# Patient Record
Sex: Male | Born: 1990 | Race: Black or African American | Hispanic: No | Marital: Single | Smoking: Never smoker
Health system: Southern US, Community
[De-identification: ages and names within clinical notes are randomized; demographics above are authoritative.]

---

## 2010-07-27 ENCOUNTER — Emergency Department (HOSPITAL_BASED_OUTPATIENT_CLINIC_OR_DEPARTMENT_OTHER)
Admission: EM | Admit: 2010-07-27 | Discharge: 2010-07-27 | Discharge status: Against Medical Advice | Payer: Managed Care, Other (non HMO) | Attending: Emergency Medicine | Admitting: Emergency Medicine

## 2010-07-27 DIAGNOSIS — K625 Hemorrhage of anus and rectum: Secondary | ICD-10-CM | POA: Insufficient documentation

## 2015-12-18 ENCOUNTER — Encounter (HOSPITAL_BASED_OUTPATIENT_CLINIC_OR_DEPARTMENT_OTHER): Payer: Self-pay

## 2015-12-18 ENCOUNTER — Emergency Department (HOSPITAL_BASED_OUTPATIENT_CLINIC_OR_DEPARTMENT_OTHER): Payer: Managed Care, Other (non HMO)

## 2015-12-18 ENCOUNTER — Ambulatory Visit (HOSPITAL_BASED_OUTPATIENT_CLINIC_OR_DEPARTMENT_OTHER)
Admission: EM | Admit: 2015-12-18 | Discharge: 2015-12-18 | Disposition: A | Discharge status: Home / Self Care | Payer: Managed Care, Other (non HMO) | Attending: Emergency Medicine | Admitting: Emergency Medicine

## 2015-12-18 ENCOUNTER — Emergency Department (HOSPITAL_COMMUNITY): Payer: Managed Care, Other (non HMO) | Admitting: Certified Registered Nurse Anesthetist

## 2015-12-18 ENCOUNTER — Encounter (HOSPITAL_COMMUNITY)
Admission: EM | Disposition: A | Discharge status: Home / Self Care | Payer: Self-pay | Source: Home / Self Care | Attending: Emergency Medicine

## 2015-12-18 DIAGNOSIS — K6389 Other specified diseases of intestine: Secondary | ICD-10-CM | POA: Diagnosis not present

## 2015-12-18 DIAGNOSIS — X58XXXA Exposure to other specified factors, initial encounter: Secondary | ICD-10-CM | POA: Insufficient documentation

## 2015-12-18 DIAGNOSIS — K3189 Other diseases of stomach and duodenum: Secondary | ICD-10-CM | POA: Insufficient documentation

## 2015-12-18 DIAGNOSIS — T189XXA Foreign body of alimentary tract, part unspecified, initial encounter: Secondary | ICD-10-CM | POA: Diagnosis not present

## 2015-12-18 HISTORY — PX: FOREIGN BODY REMOVAL: SHX962

## 2015-12-18 HISTORY — PX: ESOPHAGOGASTRODUODENOSCOPY (EGD) WITH PROPOFOL: SHX5813

## 2015-12-18 SURGERY — ESOPHAGOGASTRODUODENOSCOPY (EGD) WITH PROPOFOL
Anesthesia: General

## 2015-12-18 SURGERY — EGD (ESOPHAGOGASTRODUODENOSCOPY)
Anesthesia: Monitor Anesthesia Care

## 2015-12-18 MED ORDER — PROPOFOL 10 MG/ML IV BOLUS
INTRAVENOUS | Status: DC | PRN
  Administered 2015-12-18: 150 mg via INTRAVENOUS

## 2015-12-18 MED ORDER — SUCCINYLCHOLINE CHLORIDE 200 MG/10ML IV SOSY
PREFILLED_SYRINGE | INTRAVENOUS | Status: AC
  Filled 2015-12-18: qty 10

## 2015-12-18 MED ORDER — ONDANSETRON HCL 4 MG/2ML IJ SOLN
INTRAMUSCULAR | Status: AC
  Filled 2015-12-18: qty 2

## 2015-12-18 MED ORDER — MIDAZOLAM HCL 2 MG/2ML IJ SOLN
INTRAMUSCULAR | Status: AC
  Filled 2015-12-18: qty 2

## 2015-12-18 MED ORDER — MEPERIDINE HCL 100 MG/ML IJ SOLN: 6.2500 mg | INTRAMUSCULAR | Status: DC | PRN

## 2015-12-18 MED ORDER — LIDOCAINE HCL (CARDIAC) 20 MG/ML IV SOLN
INTRAVENOUS | Status: DC | PRN
  Administered 2015-12-18: 100 mg via INTRAVENOUS

## 2015-12-18 MED ORDER — ONDANSETRON HCL 4 MG/2ML IJ SOLN
INTRAMUSCULAR | Status: DC | PRN
  Administered 2015-12-18: 4 mg via INTRAVENOUS

## 2015-12-18 MED ORDER — MIDAZOLAM HCL 5 MG/5ML IJ SOLN
INTRAMUSCULAR | Status: DC | PRN
  Administered 2015-12-18: 2 mg via INTRAVENOUS

## 2015-12-18 MED ORDER — SUCCINYLCHOLINE CHLORIDE 20 MG/ML IJ SOLN
INTRAMUSCULAR | Status: DC | PRN
  Administered 2015-12-18: 130 mg via INTRAVENOUS

## 2015-12-18 MED ORDER — ONDANSETRON HCL 4 MG/2ML IJ SOLN: 4.0000 mg | Freq: Once | INTRAMUSCULAR | Status: DC | PRN

## 2015-12-18 MED ORDER — LACTATED RINGERS IV SOLN
INTRAVENOUS | Status: DC | PRN
  Administered 2015-12-18: 22:00:00 via INTRAVENOUS

## 2015-12-18 MED ORDER — FENTANYL CITRATE (PF) 100 MCG/2ML IJ SOLN
INTRAMUSCULAR | Status: DC | PRN
  Administered 2015-12-18: 100 ug via INTRAVENOUS

## 2015-12-18 MED ORDER — HYDROMORPHONE HCL 1 MG/ML IJ SOLN: 0.2500 mg | INTRAMUSCULAR | Status: DC | PRN

## 2015-12-18 MED ORDER — LIDOCAINE 2% (20 MG/ML) 5 ML SYRINGE
INTRAMUSCULAR | Status: AC
  Filled 2015-12-18: qty 5

## 2015-12-18 MED ORDER — PROPOFOL 10 MG/ML IV BOLUS
INTRAVENOUS | Status: AC
  Filled 2015-12-18: qty 20

## 2015-12-18 MED ORDER — LACTATED RINGERS IV SOLN
INTRAVENOUS | Status: DC
  Administered 2015-12-18: 1000 mL via INTRAVENOUS

## 2015-12-18 MED ORDER — FENTANYL CITRATE (PF) 250 MCG/5ML IJ SOLN
INTRAMUSCULAR | Status: AC
  Filled 2015-12-18: qty 5

## 2015-12-18 NOTE — ED Notes (Addendum)
Dr Jeananne RamaSchumacher at bedside.

## 2015-12-18 NOTE — Discharge Instructions (Signed)
[facility] go [facility] for further evaluation.

## 2015-12-18 NOTE — ED Notes (Signed)
Swallowed a toothpick this am-denies pain "but feels like something is there"-NAD-steady gait

## 2015-12-18 NOTE — Interval H&P Note (Signed)
History and Physical Interval Note:  12/18/2015 10:22 PM  Lin GivensKeith Dierolf  has presented today for surgery, with the diagnosis of Removal of foreign body  The various methods of treatment have been discussed with the patient and family. After consideration of risks, benefits and other options for treatment, the patient has consented to  Procedure(s): ESOPHAGOGASTRODUODENOSCOPY (EGD) WITH PROPOFOL (N/A) FOREIGN BODY REMOVAL (N/A) as a surgical intervention .  The patient's history has been reviewed, patient examined, no change in status, stable for surgery.  I have reviewed the patient's chart and labs.  Questions were answered to the patient's satisfaction.     Reeves ForthSteven Paul Vincent Ehrler

## 2015-12-18 NOTE — ED Notes (Signed)
Patient states he wants to wait until after he speaks with GI MD to get an IV. GI with patient now.

## 2015-12-18 NOTE — ED Notes (Signed)
MD at bedside. 

## 2015-12-18 NOTE — ED Notes (Signed)
Provided patient with ice water and applesauce per ENT, patient tolerating well at this time.

## 2015-12-18 NOTE — Consult Note (Signed)
 HPI :  25 y/o male with no significant past medical history, presenting with symptoms following foreign body ingestion, consulted by ER staff at the recommendation of Dr. Shoemaker from ENT. I was called at roughly 9 PM regarding this patient. He reports he had a tooth pick in his mouth around 9 AM when he was startled when sleeping, and swallowed it when he was awoken. He has had some odynophagia since that time in his upper chest and sternal notch. He is tolerating his secretions. Dr. Shoemaker performed a laryngoscopy which did not show any obvious pathology and recommended a GI consult. Before I was called the patient was given a trial of liquids and a small amount of apple sauce which was able to swallow, but continues to have some mild odynophagia and chest discomfort. Vitals normal. Prior CXR okay. The patient has no significant medical history, he denies taking any medications.   History reviewed. No pertinent past medical history.  Healthy per patient   History reviewed. No pertinent past surgical history. No surgical history No family history on file. Social History  Substance Use Topics  . Smoking status: Never Smoker   . Smokeless tobacco: None  . Alcohol Use: No   No current facility-administered medications for this encounter.   Current Outpatient Prescriptions  Medication Sig Dispense Refill  . Multiple Vitamins-Minerals (MULTIVITAMIN WITH MINERALS) tablet Take 1 tablet by mouth daily.     No Known Allergies   Review of Systems: All systems reviewed and negative except where noted in HPI.    Dg Chest 2 View  12/18/2015  CLINICAL DATA:  Swallowed a toothpick, initial encounter EXAM: CHEST  2 VIEW COMPARISON:  None. FINDINGS: The heart size and mediastinal contours are within normal limits. Both lungs are clear. The visualized skeletal structures are unremarkable. IMPRESSION: No acute abnormality is noted. No radiopaque foreign body is noted. CT is not likely to be  helpful given the size of the foreign body. Electronically Signed   By: Mark  Lukens M.D.   On: 12/18/2015 18:00    Physical Exam: BP 120/74 mmHg  Pulse 59  Temp(Src) 98 F (36.7 C) (Oral)  Resp 22  Ht 6' (1.829 m)  Wt 200 lb (90.719 kg)  BMI 27.12 kg/m2  SpO2 100% Constitutional: Pleasant,well-developed, male in no acute distress. HEENT: Normocephalic and atraumatic. Conjunctivae are normal. No scleral icterus. Neck supple.  Cardiovascular: Normal rate, regular rhythm.  Pulmonary/chest: Effort normal and breath sounds normal. No wheezing, rales or rhonchi. No crepitus Abdominal: Soft, nondistended, nontender. Bowel sounds active throughout. There are no masses palpable.  Extremities: no edema Lymphadenopathy: No cervical adenopathy noted. Neurological: Alert and oriented to person place and time. Skin: Skin is warm and dry. No rashes noted. Psychiatric: Normal mood and affect. Behavior is normal.   ASSESSMENT AND PLAN: 25 y/o male presenting with foreign body ingestion, regular sized toothpick from his report, following which he has had odynophagia and discomfort. Laryngoscopy unremarkable. Per ASGE guidelines, sharp pointed objects that are ingested, such as a toothpick, are considered a medical emergency due to risk of perforation, especially if lodged in the esophagus. Given his ongoing symptoms emergent EGD is recommended at this time to ensure this is not retained in his esophagus. Further, if the object is visualized in the stomach or small bowel we will remove it if possible as complications from more distal injury of the bowel have been reported in up to 35% of cases. In light of his recent ingestion of   liquids and small amount of apple sauce, he will be electively intubated for this case with anesthesia assistance. I have discussed the risks / benefits of endoscopy with him and anesthesia and he wishes to proceed. Further recommendations pending the result.  Ileene PatrickSteven Armbruster,  MD [facility] Pager [phone]

## 2015-12-18 NOTE — Consult Note (Signed)
ENT CONSULT:  Reason for Consult: Possible foreign body ingestion Referring Physician: [facility]  Christopher Payne is an 25 y.o. male.  HPI: The patient presents for evaluation of dysphagia and low anterior neck pain. He reports swallowing a wooden toothpick early this morning. No respiratory issues, hoarseness or breathing concerns. He had a moderate amount of pain and difficulty swallowing liquids. No regurgitation, aspiration or previous swallowing dysfunction.  History reviewed. No pertinent past medical history.  History reviewed. No pertinent past surgical history.  No family history on file.  Social History:  reports that he has never smoked. He does not have any smokeless tobacco history on file. He reports that he does not drink alcohol or use illicit drugs.  Allergies: No Known Allergies  Medications: I have reviewed the patient's current medications.  No results found for this or any previous visit (from the past 48 hour(s)).  Dg Chest 2 View  12/18/2015  CLINICAL DATA:  Swallowed a toothpick, initial encounter EXAM: CHEST  2 VIEW COMPARISON:  None. FINDINGS: The heart size and mediastinal contours are within normal limits. Both lungs are clear. The visualized skeletal structures are unremarkable. IMPRESSION: No acute abnormality is noted. No radiopaque foreign body is noted. CT is not likely to be helpful given the size of the foreign body. Electronically Signed   By: Alcide CleverMark  Lukens M.D.   On: 12/18/2015 18:00    ROS:ROS 12 systems reviewed and negative except as stated in HPI   Blood pressure 115/73, pulse 68, temperature 98.4 F (36.9 C), temperature source Oral, resp. rate 20, height 6' (1.829 m), weight 90.719 kg (200 lb), SpO2 100 %.  PHYSICAL EXAM: General appearance - alert, well appearing, and in no distress Mouth - mucous membranes moist, pharynx normal without lesions, tonsils normal and No evidence of foreign body, bleeding or mucosal irritation Neck -  supple, no significant adenopathy, nontender to palpation, no crepitance  Procedure Note: Flexible Laryngoscopy  Risks/benefits and possible complications were discussed in detail. Patient understands and agrees to proceed with procedure.   Procedure: 4 mm flexible laryngoscope inserted through the nasal passageway with minimal discomfort. Nasal cavity and nasopharynx patent without discharge, mass or polyp. Normal base of tongue and supraglottis, normal vocal cord mobility. No evidence of vocal cord paralysis, mass or lesion. No evidence of foreign body, laceration or bleeding to the level of the hypopharynx Normal hypopharynx without evidence of mass or aspiration.  Patient tolerated procedure without complication or difficulty.  Kinnie Scalesavid L. Annalee GentaShoemaker, M.D.  Studies Reviewed: Chest x-ray  Assessment/Plan: The patient presents for evaluation of foreign body sensation after swallowing a toothpick. No evidence of foreign body on oropharyngeal or flexible laryngoscopy exam. No evidence of laceration or irritation. Given the patient's history and concerns he may benefit from evaluation by gastroenterology for esophagoscopy to rule out additional foreign body or other esophageal injury.  Christopher Payne 12/18/2015, 8:42 PM

## 2015-12-18 NOTE — Anesthesia Procedure Notes (Signed)
Procedure Name: Intubation Date/Time: 12/18/2015 10:30 PM Performed by: Shiraz Bastyr S Pre-anesthesia Checklist: Patient identified, Emergency Drugs available, Suction available, Patient being monitored and Timeout performed Patient Re-evaluated:Patient Re-evaluated prior to inductionOxygen Delivery Method: Circle system utilized Preoxygenation: Pre-oxygenation with 100% oxygen Intubation Type: IV induction Ventilation: Mask ventilation without difficulty Laryngoscope Size: Mac and 4 Grade View: Grade I Tube type: Oral Tube size: 7.5 mm Number of attempts: 1 Airway Equipment and Method: Stylet Placement Confirmation: ETT inserted through vocal cords under direct vision,  positive ETCO2 and breath sounds checked- equal and bilateral Secured at: 22 cm Tube secured with: Tape Dental Injury: Teeth and Oropharynx as per pre-operative assessment

## 2015-12-18 NOTE — ED Notes (Signed)
Patient to go to endoscopy. Informed consent completed by endo RN.

## 2015-12-18 NOTE — ED Notes (Signed)
Patient now NPO, in event GI plans to endo scope tonight. Patient shows no signs of distress.

## 2015-12-18 NOTE — Anesthesia Preprocedure Evaluation (Signed)
Anesthesia Evaluation  Patient identified by MRN, date of birth, ID band Patient awake    Reviewed: Allergy & Precautions, NPO status , Patient's Chart, lab work & pertinent test results  Airway Mallampati: I  TM Distance: >3 FB Neck ROM: Full    Dental   Pulmonary    Pulmonary exam normal       Cardiovascular Normal cardiovascular exam    Neuro/Psych    GI/Hepatic   Endo/Other    Renal/GU      Musculoskeletal   Abdominal   Peds  Hematology   Anesthesia Other Findings   Reproductive/Obstetrics                             Anesthesia Physical Anesthesia Plan  ASA: II and emergent  Anesthesia Plan: General   Post-op Pain Management:    Induction: Intravenous, Rapid sequence and Cricoid pressure planned  Airway Management Planned: Oral ETT  Additional Equipment:   Intra-op Plan:   Post-operative Plan: Extubation in OR  Informed Consent: I have reviewed the patients History and Physical, chart, labs and discussed the procedure including the risks, benefits and alternatives for the proposed anesthesia with the patient or authorized representative who has indicated his/her understanding and acceptance.     Plan Discussed with: CRNA and Surgeon  Anesthesia Plan Comments:         Anesthesia Quick Evaluation  

## 2015-12-18 NOTE — H&P (View-Only) (Signed)
HPI :  25 y/o male with no significant past medical history, presenting with symptoms following foreign body ingestion, consulted by ER staff at the recommendation of Dr. Annalee GentaShoemaker from ENT. I was called at roughly 9 PM regarding this patient. He reports he had a tooth pick in his mouth around 9 AM when he was startled when sleeping, and swallowed it when he was awoken. He has had some odynophagia since that time in his upper chest and sternal notch. He is tolerating his secretions. Dr. Annalee GentaShoemaker performed a laryngoscopy which did not show any obvious pathology and recommended a GI consult. Before I was called the patient was given a trial of liquids and a small amount of apple sauce which was able to swallow, but continues to have some mild odynophagia and chest discomfort. Vitals normal. Prior CXR okay. The patient has no significant medical history, he denies taking any medications.   History reviewed. No pertinent past medical history.  Healthy per patient   History reviewed. No pertinent past surgical history. No surgical history No family history on file. Social History  Substance Use Topics  . Smoking status: Never Smoker   . Smokeless tobacco: None  . Alcohol Use: No   No current facility-administered medications for this encounter.   Current Outpatient Prescriptions  Medication Sig Dispense Refill  . Multiple Vitamins-Minerals (MULTIVITAMIN WITH MINERALS) tablet Take 1 tablet by mouth daily.     No Known Allergies   Review of Systems: All systems reviewed and negative except where noted in HPI.    Dg Chest 2 View  12/18/2015  CLINICAL DATA:  Swallowed a toothpick, initial encounter EXAM: CHEST  2 VIEW COMPARISON:  None. FINDINGS: The heart size and mediastinal contours are within normal limits. Both lungs are clear. The visualized skeletal structures are unremarkable. IMPRESSION: No acute abnormality is noted. No radiopaque foreign body is noted. CT is not likely to be  helpful given the size of the foreign body. Electronically Signed   By: Alcide CleverMark  Lukens M.D.   On: 12/18/2015 18:00    Physical Exam: BP 120/74 mmHg  Pulse 59  Temp(Src) 98 F (36.7 C) (Oral)  Resp 22  Ht 6' (1.829 m)  Wt 200 lb (90.719 kg)  BMI 27.12 kg/m2  SpO2 100% Constitutional: Pleasant,well-developed, male in no acute distress. HEENT: Normocephalic and atraumatic. Conjunctivae are normal. No scleral icterus. Neck supple.  Cardiovascular: Normal rate, regular rhythm.  Pulmonary/chest: Effort normal and breath sounds normal. No wheezing, rales or rhonchi. No crepitus Abdominal: Soft, nondistended, nontender. Bowel sounds active throughout. There are no masses palpable.  Extremities: no edema Lymphadenopathy: No cervical adenopathy noted. Neurological: Alert and oriented to person place and time. Skin: Skin is warm and dry. No rashes noted. Psychiatric: Normal mood and affect. Behavior is normal.   ASSESSMENT AND PLAN: 25 y/o male presenting with foreign body ingestion, regular sized toothpick from his report, following which he has had odynophagia and discomfort. Laryngoscopy unremarkable. Per ASGE guidelines, sharp pointed objects that are ingested, such as a toothpick, are considered a medical emergency due to risk of perforation, especially if lodged in the esophagus. Given his ongoing symptoms emergent EGD is recommended at this time to ensure this is not retained in his esophagus. Further, if the object is visualized in the stomach or small bowel we will remove it if possible as complications from more distal injury of the bowel have been reported in up to 35% of cases. In light of his recent ingestion of  liquids and small amount of apple sauce, he will be electively intubated for this case with anesthesia assistance. I have discussed the risks / benefits of endoscopy with him and anesthesia and he wishes to proceed. Further recommendations pending the result.  Ileene PatrickSteven Vibha Ferdig,  MD [facility] Pager [phone]

## 2015-12-18 NOTE — ED Notes (Signed)
Pa  at bedside. 

## 2015-12-18 NOTE — ED Notes (Signed)
[facility] with [facility] Object.

## 2015-12-18 NOTE — Anesthesia Postprocedure Evaluation (Signed)
Anesthesia Post Note  Patient: Christopher GivensKeith Payne  Procedure(s) Performed: Procedure(s) (LRB): ESOPHAGOGASTRODUODENOSCOPY (EGD) WITH PROPOFOL (N/A) FOREIGN BODY REMOVAL (N/A)  Patient location during evaluation: PACU Anesthesia Type: General Level of consciousness: awake and alert Pain management: pain level controlled Vital Signs Assessment: post-procedure vital signs reviewed and stable Respiratory status: spontaneous breathing, nonlabored ventilation, respiratory function stable and patient connected to nasal cannula oxygen Cardiovascular status: blood pressure returned to baseline and stable Postop Assessment: no signs of nausea or vomiting Anesthetic complications: no    Last Vitals:  Filed Vitals:   12/18/15 2317 12/18/15 2330  BP: 112/64 135/73  Pulse: 62 72  Temp: 36.5 C 36.5 C  Resp: 15 19    Last Pain:  Filed Vitals:   12/18/15 2347  PainSc: 5                  Solstice Lastinger DAVID

## 2015-12-18 NOTE — ED Provider Notes (Signed)
CSN: [id number]     Arrival date & time 12/18/15  1650 History   First MD Initiated Contact with Patient 12/18/15 1655     Chief Complaint  Patient presents with  . Swallowed Foreign Body    (Consider location/radiation/quality/duration/timing/severity/associated sxs/prior Treatment) Patient is a 25 y.o. male presenting with foreign body swallowed. The history is provided by the patient and medical records. No language interpreter was used.  Swallowed Foreign Body Pertinent negatives include no abdominal pain, chills, congestion, coughing, fever, headaches, nausea, neck pain, rash, sore throat or vomiting.   Christopher Payne is an otherwise healthy 25 y.o. male  who presents to the Emergency Department after swallowing a toothpick this morning. Patient states after he swallowed toothpick, he felt as if it was stuck in his throat. This sensation has not improved throughout the day. Patient thought it may pass, but he still feels as if something is stuck in his throat. He has no difficulty breathing or swallowing. He has not tried to eat or drink anything since the incident, because he was scared it may make things worse. Denies chest or throat pain, just for body sensation that he states is uncomfortable. He tried to "gag it up". Denies any other associated symptoms.   History reviewed. No pertinent past medical history. History reviewed. No pertinent past surgical history. No family history on file. Social History  Substance Use Topics  . Smoking status: Never Smoker   . Smokeless tobacco: None  . Alcohol Use: No    Review of Systems  Constitutional: Negative for fever and chills.  HENT: Negative for congestion, sore throat and trouble swallowing.   Eyes: Negative for visual disturbance.  Respiratory: Negative for cough and shortness of breath.   Cardiovascular: Negative.   Gastrointestinal: Negative for nausea, vomiting and abdominal pain.  Genitourinary: Negative for dysuria.   Musculoskeletal: Negative for back pain and neck pain.  Skin: Negative for rash.  Neurological: Negative for headaches.      Allergies  Review of patient's allergies indicates no known allergies.  Home Medications   Prior to Admission medications   Not on File   BP 123/83 mmHg  Pulse 63  Temp(Src) 98.7 F (37.1 C) (Oral)  Resp 16  Ht 6' (1.829 m)  Wt 90.719 kg  BMI 27.12 kg/m2  SpO2 100% Physical Exam  Constitutional: He is oriented to person, place, and time. He appears well-developed and well-nourished.  Alert and in no acute distress  HENT:  Head: Normocephalic and atraumatic.  Mouth/Throat: Oropharynx is clear and moist. No oropharyngeal exudate.  Cardiovascular: Normal rate, regular rhythm and normal heart sounds.  Exam reveals no gallop and no friction rub.   No murmur heard. Pulmonary/Chest: Effort normal and breath sounds normal. No respiratory distress. He has no wheezes. He has no rales. He exhibits no tenderness.  Speaking in full sentences. 100% O2 on RA.   Abdominal: Soft. He exhibits no distension. There is no tenderness.  Musculoskeletal: He exhibits no edema.  Neurological: He is alert and oriented to person, place, and time.  Skin: Skin is warm and dry.  Nursing note and vitals reviewed.   ED Course  Procedures (including critical care time) Labs Review Labs Reviewed - No data to display  Imaging Review Dg Chest 2 View  12/18/2015  CLINICAL DATA:  Swallowed a toothpick, initial encounter EXAM: CHEST  2 VIEW COMPARISON:  None. FINDINGS: The heart size and mediastinal contours are within normal limits. Both lungs are clear. The visualized skeletal  structures are unremarkable. IMPRESSION: No acute abnormality is noted. No radiopaque foreign body is noted. CT is not likely to be helpful given the size of the foreign body. Electronically Signed   By: Alcide CleverMark  Lukens M.D.   On: 12/18/2015 18:00   I have personally reviewed and evaluated these images and lab  results as part of my medical decision-making.   EKG Interpretation None      MDM   Final diagnoses:  Swallowed foreign body, initial encounter   Christopher Payne presents to ED after swallowing a toothpick this morning. Feels as if toothpick is still stuck in his throat. On exam, patient is well appearing, speaking in full sentences with clear lung exam. No FB seen on oral examination. GI consulted who recommends CXR and see if patient can tolerate PO. CXR reassuring. Tolerating PO without difficulty. ENT consulted - [facility]. Spoke with [facility] physician Dr. Jodi MourningZavitz. ENT Dr. Annalee GentaShoemaker to be informed of patient's arrival in ED.   Patient seen by and discussed with Dr. Adela LankFloyd who agrees with treatment plan.    [facility] Pilcher Ward, PA-C 12/18/15 1827  Melene Planan Floyd, DO 12/18/15 2308

## 2015-12-18 NOTE — ED Notes (Signed)
[facility]

## 2015-12-18 NOTE — Transfer of Care (Signed)
Immediate Anesthesia Transfer of Care Note  Patient: Christopher GivensKeith Payne  Procedure(s) Performed: Procedure(s): ESOPHAGOGASTRODUODENOSCOPY (EGD) WITH PROPOFOL (N/A) FOREIGN BODY REMOVAL (N/A)  Patient Location: PACU  Anesthesia Type:General  Level of Consciousness: awake, oriented and patient cooperative  Airway & Oxygen Therapy: Patient Spontanous Breathing and Patient connected to nasal cannula oxygen  Post-op Assessment: Report given to RN and Post -op Vital signs reviewed and stable  Post vital signs: Reviewed and stable  Last Vitals:  Filed Vitals:   12/18/15 1928 12/18/15 2130  BP: 115/73 120/74  Pulse: 68 59  Temp: 36.9 C 36.7 C  Resp: 20 22    Last Pain:  Filed Vitals:   12/18/15 2133  PainSc: 5          Complications: No apparent anesthesia complications

## 2015-12-18 NOTE — Op Note (Signed)
[facility] Patient Name: Lin GivensKeith Mulkern Procedure Date : 12/18/2015 MRN: [id number] Attending MD: Willaim RayasSteven P. Adela LankArmbruster , MD Date of Birth: [birth year] CSN: [id number] Age: [age] Admit Type: Inpatient Procedure:                Upper GI endoscopy Indications:              foreign body in the GI tract, toothpick ingestion Providers:                Viviann SpareSteven P. Adela LankArmbruster, MD, Priscella MannAutumn Goldsmith, RN,                            Beryle BeamsJanie Billups, Technician Referring MD:              Medicines:                Monitored Anesthesia Care Complications:            No immediate complications. Estimated blood loss:                            None. Estimated Blood Loss:     Estimated blood loss: none. Procedure:                Pre-Anesthesia Assessment:                           - Prior to the procedure, a History and Physical                            was performed, and patient medications and                            allergies were reviewed. The patient's tolerance of                            previous anesthesia was also reviewed. The risks                            and benefits of the procedure and the sedation                            options and risks were discussed with the patient.                            All questions were answered, and informed consent                            was obtained. Prior Anticoagulants: The patient has                            taken no previous anticoagulant or antiplatelet                            agents. ASA Grade Assessment: II - A patient with  mild systemic disease. After reviewing the risks                            and benefits, the patient was deemed in                            satisfactory condition to undergo the procedure.                           After obtaining informed consent, the endoscope was                            passed under direct vision. Throughout the                            procedure,  the patient's blood pressure, pulse, and                            oxygen saturations were monitored continuously. The                            EG-[id number] ([id number]) scope was introduced through the                            mouth, and advanced to the jejunum. The upper GI                            endoscopy was accomplished without difficulty. The                            patient tolerated the procedure well. Scope In: Scope Out: Findings:      Esophagogastric landmarks were identified: the Z-line was found at 44       cm, the gastroesophageal junction was found at 44 cm and the upper       extent of the gastric folds was found at 44 cm from the incisors.      The exam of the esophagus was otherwise normal. Tooth pick was not in       the esophagus, no mucosal trauma to the esophagus was noted.      Patchy moderately erythematous mucosa with what appeared to be multiple       small wrents of the mucosa, likely from trauma due to the toothpick, was       found in the entire examined stomach. No active bleeding was noted.      The exam of the stomach was otherwise normal.      Patchy erythematous mucosa with small wrents was found in the entire       duodenum consistent with trauma from the toothpick.      The upper endoscope was removed and a colonoscope was placed to reach       the proximal jejunum. Patchy erythematous mucosa with small mucosal       wrents was found in the jejunum consistent with mucosal trauma from the       toothpick. The toothpick was not visualized on this exam. Impression:               -  Esophagogastric landmarks identified.                           - Normal esophagus                           - Erythematous mucosa in the stomach.                           - Mucosal trauma related changes in the stomach,                            duodenum, and proximal jejunum from toothpick,                            however the toothpick has passed to the more distal                             small bowel and was not visualized. Moderate Sedation:      No moderate sedation, case performed with MAC Recommendation:           - Patient has a contact number available for                            emergencies. The signs and symptoms of potential                            delayed complications were discussed with the                            patient. Return to normal activities tomorrow.                            Written discharge instructions were provided to the                            patient.                           - Okay to discharge home                           - Liquid diet rest of today, advance to soft diet                            tomorrow as tolerated                           - Continue present medications.                           - Monitor for abdominal pain, fevers, vomiting,                            etc. Please return to the hospital if you develop  any of these symptoms                           - You will be contacted by our office for                            reassessment over the phone tomorrow, with                            consideration for imaging should you develop any                            symptoms. Procedure Code(s):        --- Professional ---                           [id number], Esophagogastroduodenoscopy, flexible,                            transoral; diagnostic, including collection of                            specimen(s) by brushing or washing, when performed                            (separate procedure) Diagnosis Code(s):        --- Professional ---                           K31.89, Other diseases of stomach and duodenum                           K63.89, Other specified diseases of intestine                           T18.9XXA, Foreign body of alimentary tract, part                            unspecified, initial encounter CPT copyright 2016 [employer].  All rights reserved. The codes documented in this report are preliminary and upon coder review may  be revised to meet current compliance requirements. Viviann SpareSteven P. Armbruster, MD 12/18/2015 11:06:36 PM This report has been signed electronically. Number of Addenda: 0

## 2015-12-19 ENCOUNTER — Encounter (HOSPITAL_COMMUNITY): Payer: Self-pay | Admitting: Gastroenterology

## 2015-12-19 ENCOUNTER — Telehealth: Payer: Self-pay | Admitting: *Deleted

## 2015-12-19 NOTE — Telephone Encounter (Signed)
-----   Message from Ruffin FrederickSteven Paul Armbruster, MD sent at 12/18/2015 11:14 PM EDT ----- Rene Kocheregina I performed an urgent endoscopy for this patient this evening after he swallowed a tooth pick, it had already passed into his small bowel. Can you call him Wed to ensure he is doing okay? If he has any pain, fevers, rectal bleeding he needs to go to the ER for reassessment. More than likely this will pass on its own, but has potential to perforate the bowel if it gets hung up. He felt well upon discharge this evening. Thanks

## 2015-12-19 NOTE — Telephone Encounter (Signed)
Left a message for patient to call back with update on condition.

## 2015-12-19 NOTE — Telephone Encounter (Signed)
Left a message for patient to call back. 

## 2015-12-20 NOTE — Telephone Encounter (Signed)
Patient did not return calls. MD aware.

## 2016-11-16 ENCOUNTER — Encounter (HOSPITAL_BASED_OUTPATIENT_CLINIC_OR_DEPARTMENT_OTHER): Payer: Self-pay | Admitting: Emergency Medicine

## 2016-11-16 ENCOUNTER — Emergency Department (HOSPITAL_BASED_OUTPATIENT_CLINIC_OR_DEPARTMENT_OTHER)
Admission: EM | Admit: 2016-11-16 | Discharge: 2016-11-16 | Disposition: A | Discharge status: Home / Self Care | Payer: Managed Care, Other (non HMO) | Attending: Emergency Medicine | Admitting: Emergency Medicine

## 2016-11-16 DIAGNOSIS — G44209 Tension-type headache, unspecified, not intractable: Secondary | ICD-10-CM | POA: Diagnosis not present

## 2016-11-16 DIAGNOSIS — G44201 Tension-type headache, unspecified, intractable: Secondary | ICD-10-CM

## 2016-11-16 DIAGNOSIS — R51 Headache: Secondary | ICD-10-CM | POA: Diagnosis present

## 2016-11-16 MED ORDER — ACETAMINOPHEN 325 MG PO TABS
650.0000 mg | ORAL_TABLET | Freq: Once | ORAL | Status: DC
  Filled 2016-11-16: qty 2

## 2016-11-16 MED ORDER — KETOROLAC TROMETHAMINE 60 MG/2ML IM SOLN
60.0000 mg | Freq: Once | INTRAMUSCULAR | Status: AC
  Administered 2016-11-16: 60 mg via INTRAMUSCULAR
  Filled 2016-11-16: qty 2

## 2016-11-16 MED ORDER — IBUPROFEN 800 MG PO TABS
800.0000 mg | ORAL_TABLET | Freq: Once | ORAL | Status: DC
  Filled 2016-11-16: qty 1

## 2016-11-16 NOTE — ED Notes (Signed)
Pt given d/c instructions as per chart. Verbalizes understanding. No questions. 

## 2016-11-16 NOTE — ED Provider Notes (Signed)
[facility] Provider Note   CSN: [id number] Arrival date & time: 11/16/16  1942  By signing my name below, I, Christopher Payne, attest that this documentation has been prepared under the direction and in the presence of [name], Sharlette Jansma, MD . Electronically Signed: Teofilo PodMatthew P. Payne, ED Scribe. 11/16/2016. 9:07 PM.    History   Chief Complaint Chief Complaint  Patient presents with  . Headache    The history is provided by the patient. No language interpreter was used.   HPI Comments:  Lin GivensKeith Payne is a 26 y.o. male who presents to the Emergency Department complaining of an intermittent headache x 2 days. He states that the headache is mild at this time, but has been worse. Pt reports that he has been working long hours and has been taking caffeine pills frequently, and has not been getting much sleep. He states that he is otherwise healthy. No alleviating factors noted. Denies fever, photophobia.    History reviewed. No pertinent past medical history.  Patient Active Problem List   Diagnosis Date Noted  . Swallowed foreign body     Past Surgical History:  Procedure Laterality Date  . ESOPHAGOGASTRODUODENOSCOPY (EGD) WITH PROPOFOL N/A 12/18/2015   Procedure: ESOPHAGOGASTRODUODENOSCOPY (EGD) WITH PROPOFOL;  Surgeon: Ruffin FrederickSteven Paul Armbruster, MD;  [facility];  Service: Gastroenterology;  Laterality: N/A;  . FOREIGN BODY REMOVAL N/A 12/18/2015   Procedure: FOREIGN BODY REMOVAL;  Surgeon: Ruffin FrederickSteven Paul Armbruster, MD;  [facility];  Service: Gastroenterology;  Laterality: N/A;       Home Medications    Prior to Admission medications   Medication Sig Start Date End Date Taking? Authorizing Provider  Multiple Vitamins-Minerals (MULTIVITAMIN WITH MINERALS) tablet Take 1 tablet by mouth daily.    [provider]    Family History No family history on file.  Social History Social History  Substance Use Topics  . Smoking status:  Never Smoker  . Smokeless tobacco: Not on file  . Alcohol use No     Allergies   Patient has no known allergies.   Review of Systems Review of Systems All systems reviewed and are negative for acute change except as noted in the HPI.   Physical Exam Updated Vital Signs BP 127/75 (BP Location: Left Arm)   Pulse 75   Temp 98.4 F (36.9 C) (Oral)   Resp 16   SpO2 100%   Physical Exam  Constitutional: He is oriented to person, place, and time. He appears well-developed and well-nourished. No distress.  HENT:  Head: Normocephalic and atraumatic.  Mouth/Throat: Oropharynx is clear and moist.  Eyes: Conjunctivae and EOM are normal. Pupils are equal, round, and reactive to light. Right eye exhibits no discharge. Left eye exhibits no discharge.  photophobia  Neck: Normal range of motion. Neck supple. No spinous process tenderness present. No neck rigidity. No Brudzinski's sign and no Kernig's sign noted.  Cardiovascular: Normal rate, normal heart sounds and intact distal pulses.   No murmur heard. Pulmonary/Chest: Effort normal and breath sounds normal. No respiratory distress. He has no wheezes. He has no rales.  Abdominal: Soft. He exhibits no distension. There is no tenderness.  Musculoskeletal: Normal range of motion. He exhibits no edema or tenderness.  Lymphadenopathy:    He has no cervical adenopathy.  Neurological: He is alert and oriented to person, place, and time. He has normal strength. No cranial nerve deficit or sensory deficit. Coordination and gait normal. GCS eye subscore is 4. GCS verbal subscore is  5. GCS motor subscore is 6.  Skin: Skin is warm and dry.  Psychiatric: He has a normal mood and affect. His behavior is normal.  Nursing note and vitals reviewed.    ED Treatments / Results  DIAGNOSTIC STUDIES:  Oxygen Saturation is 100% on RA, normal by my interpretation.    COORDINATION OF CARE:  9:04 PM Discussed treatment plan with pt at bedside and pt  agreed to plan.   Labs (all labs ordered are listed, but only abnormal results are displayed) Labs Reviewed - No data to display  EKG  EKG Interpretation None       Radiology No results found.  Procedures Procedures (including critical care time)  Medications Ordered in ED Medications  ibuprofen (ADVIL,MOTRIN) tablet 800 mg (not administered)  acetaminophen (TYLENOL) tablet 650 mg (not administered)     Initial Impression / Assessment and Plan / ED Course  I have reviewed the triage vital signs and the nursing notes.  Pertinent labs & imaging results that were available during my care of the patient were reviewed by me and considered in my medical decision making (see chart for details).     Pt with typical common HA which is most likely from taking caffiene pills and lack of sleep without sx suggestive of SAH(sudden onset, worst of life, or deficits), infection, or cavernous vein thrombosis.  Normal neuro exam and vital signs. Pt given toradol with improvement of headache and he was d/ced home.  Final Clinical Impressions(s) / ED Diagnoses   Final diagnoses:  Acute intractable tension-type headache    New Prescriptions New Prescriptions   No medications on file   I personally performed the services described in this documentation, which was scribed in my presence.  The recorded information has been reviewed and considered.     Gwyneth Sprout, MD 11/16/16 2137

## 2016-11-16 NOTE — ED Triage Notes (Signed)
PT presents to ED with complaints of headache that started a few days ago. Pt states he has been working a lot and taking caffeine pills and not sleeping much.

## 2017-03-12 IMAGING — CR DG CHEST 2V
2 series · 2 of 2 positions shown · non-contrast
Comparison: None.

CLINICAL DATA: Swallowed a toothpick, initial encounter

EXAM:
CHEST  2 VIEW

[w chest pa]
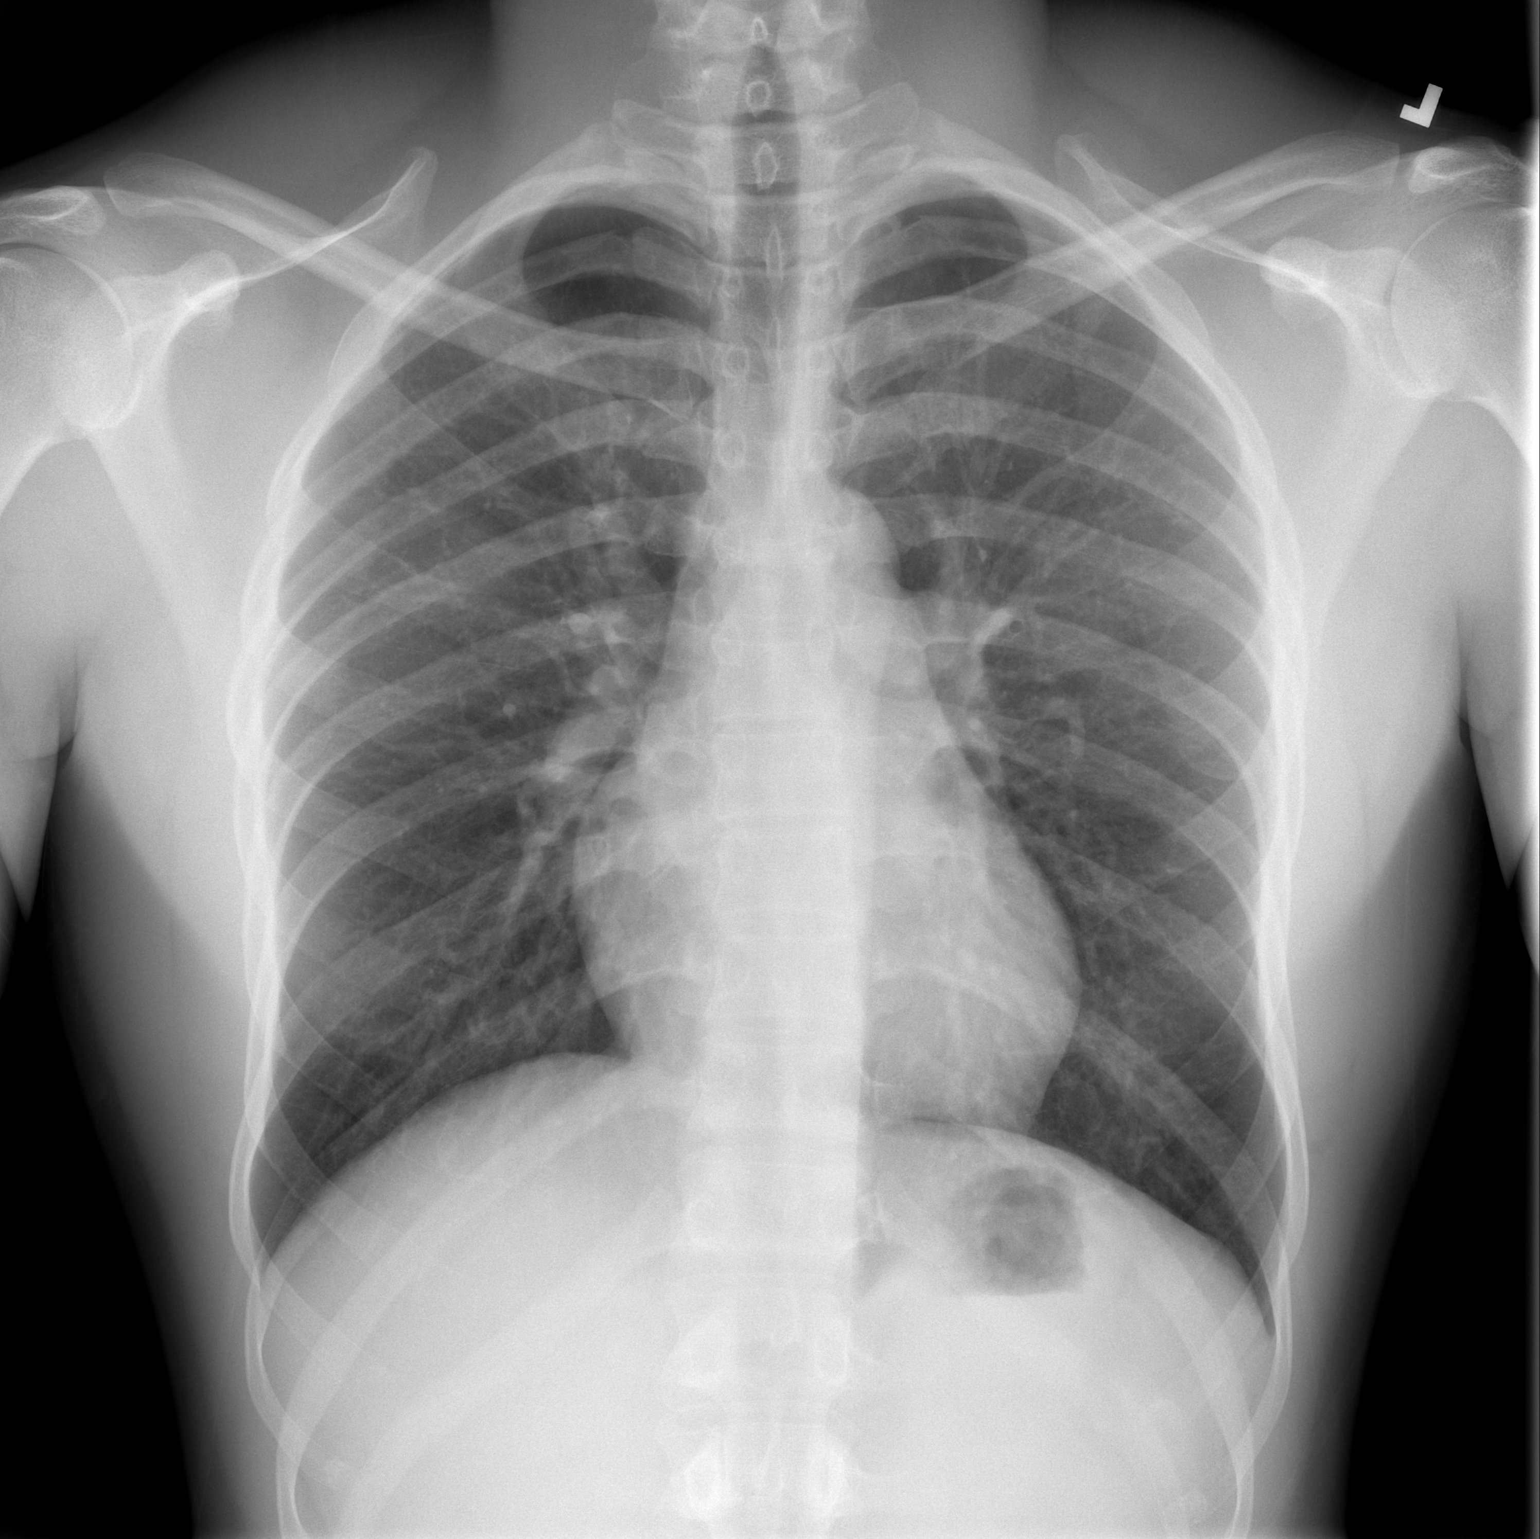

[w chest lat]
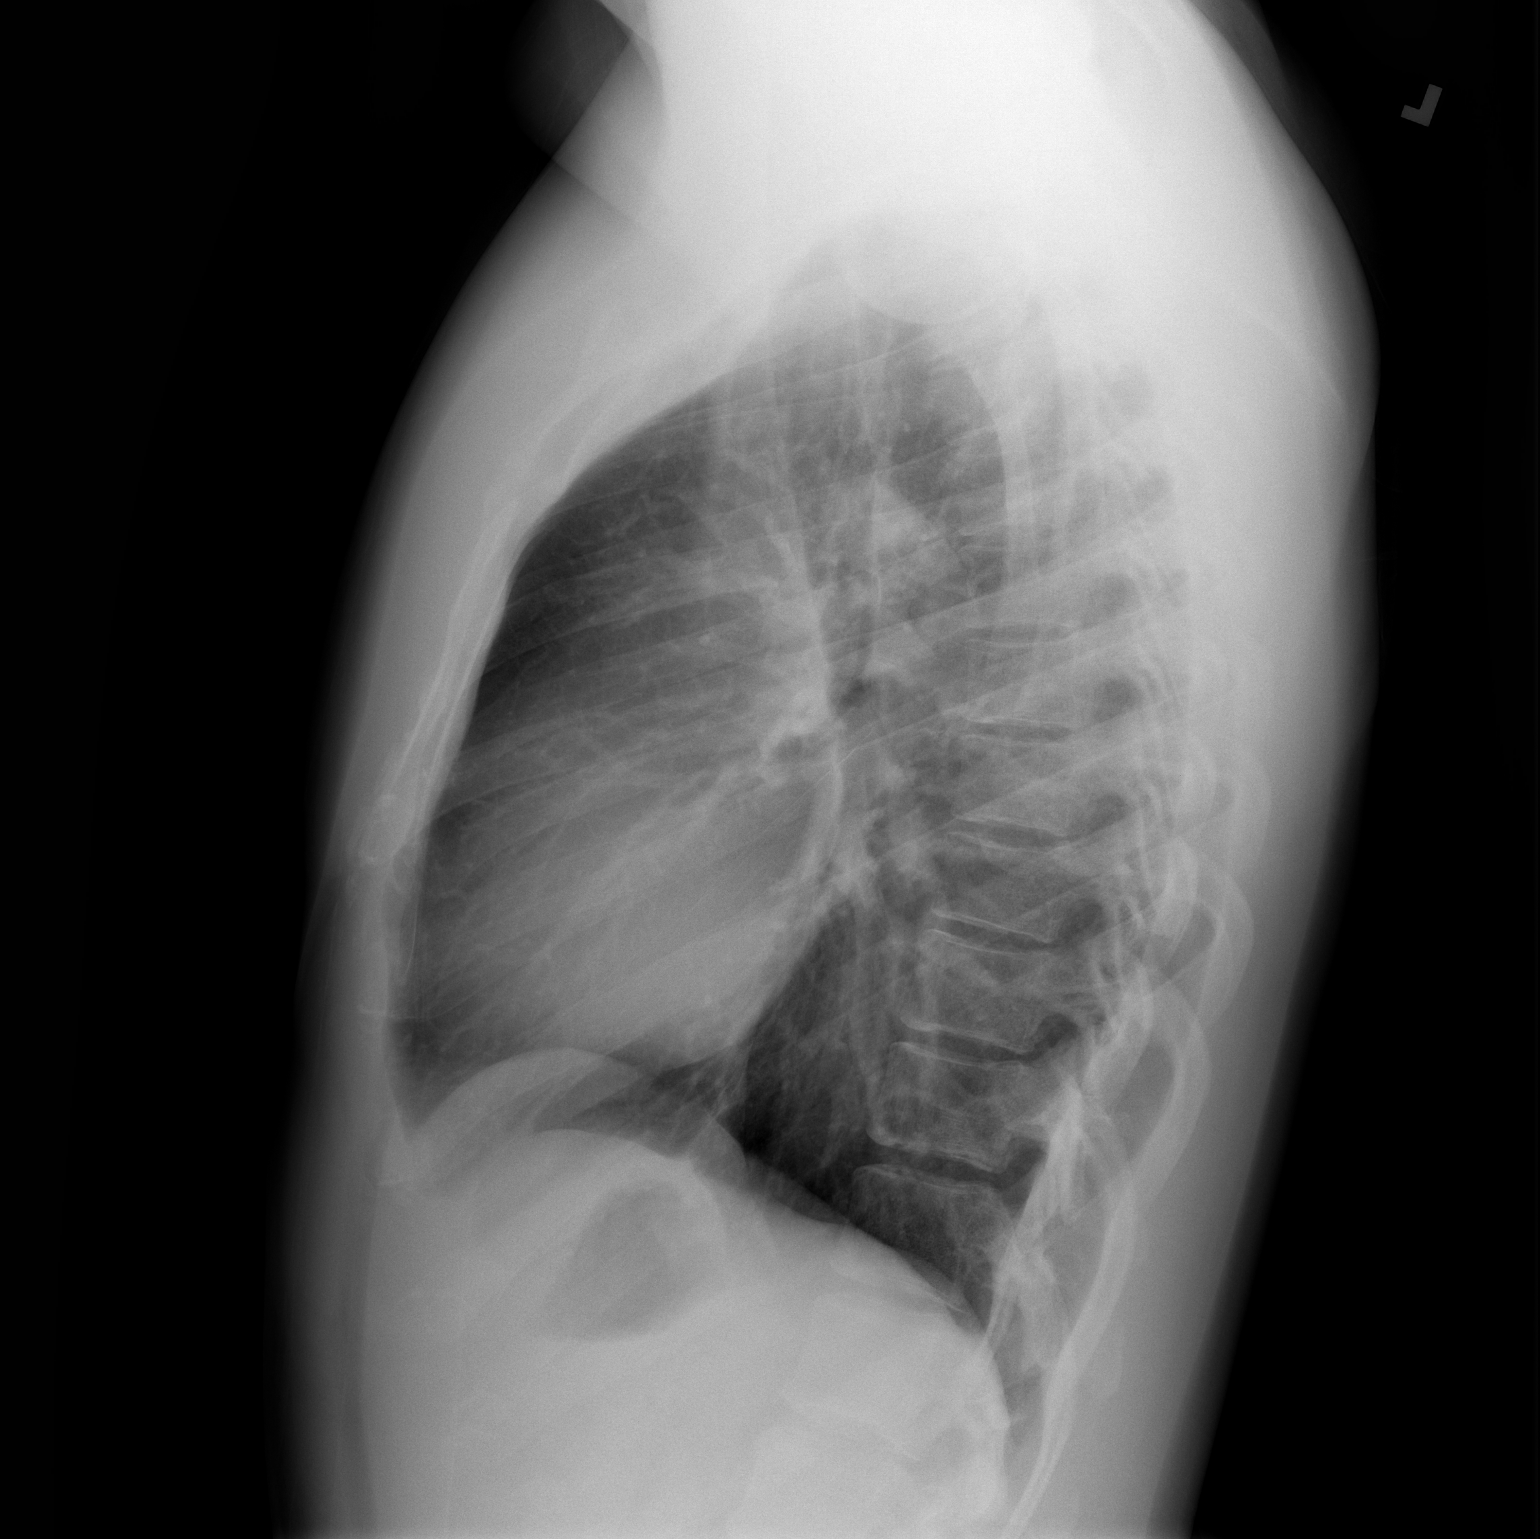

[2 of 2 positions shown; findings below may reference images not displayed]

FINDINGS: The heart size and mediastinal contours are within normal limits.
Both lungs are clear. The visualized skeletal structures are
unremarkable.
IMPRESSION: No acute abnormality is noted. No radiopaque foreign body is noted.
CT is not likely to be helpful given the size of the foreign body.
